# Patient Record
Sex: Male | Born: 1965 | Race: White | Hispanic: No
Health system: Southern US, Community
[De-identification: ages and names within clinical notes are randomized; demographics above are authoritative.]

---

## 1998-11-14 ENCOUNTER — Encounter: Payer: Self-pay | Admitting: Neurosurgery

## 1998-11-18 ENCOUNTER — Inpatient Hospital Stay (HOSPITAL_COMMUNITY): Admission: RE | Admit: 1998-11-18 | Discharge: 1998-11-19 | Payer: Self-pay | Admitting: Neurosurgery

## 1998-11-18 ENCOUNTER — Encounter: Payer: Self-pay | Admitting: Neurosurgery

## 1998-12-08 ENCOUNTER — Encounter: Payer: Self-pay | Admitting: Neurosurgery

## 1998-12-08 ENCOUNTER — Encounter: Admission: RE | Admit: 1998-12-08 | Discharge: 1998-12-08 | Payer: Self-pay | Admitting: Neurosurgery

## 1998-12-30 ENCOUNTER — Encounter: Admission: RE | Admit: 1998-12-30 | Discharge: 1998-12-30 | Payer: Self-pay | Admitting: Neurosurgery

## 1998-12-30 ENCOUNTER — Encounter: Payer: Self-pay | Admitting: Neurosurgery

## 1999-03-26 ENCOUNTER — Encounter: Admission: RE | Admit: 1999-03-26 | Discharge: 1999-03-26 | Payer: Self-pay | Admitting: Neurosurgery

## 1999-03-26 ENCOUNTER — Encounter: Payer: Self-pay | Admitting: Neurosurgery

## 2007-10-27 ENCOUNTER — Ambulatory Visit (HOSPITAL_COMMUNITY): Admission: RE | Admit: 2007-10-27 | Discharge: 2007-10-27 | Payer: Self-pay | Admitting: Neurosurgery

## 2007-10-30 ENCOUNTER — Ambulatory Visit (HOSPITAL_COMMUNITY): Admission: RE | Admit: 2007-10-30 | Discharge: 2007-10-30 | Payer: Self-pay | Admitting: Neurosurgery

## 2007-10-30 IMAGING — CT CT L SPINE W/ CM
2 of 13 series · 5 of 33 positions shown, 6 images · IV contrast (omnipaque)
Comparison: None
COMPARISON: None

CLINICAL DATA: Back pain with lumbar radiculopathy.

]
MYELOGRAM LUMBAR
TECHNIQUE: Lumbar puncture injection of contrast was performed by
Dr. JUMPER at the L3-4 level. Following injection of intrathecal
Omnipaque contrast, spine imaging in multiple projections was
performed using fluoroscopy.
CT MYELOGRAPHY LUMBAR SPINE
TECHNIQUE: CT imaging of the lumbar spine was performed after
intrathecal contrast administration.  Multiplanar CT image
reconstructions were also generated.

[Series 2: l-spine helical · axial · 0.27mm/px · z∈[-230,-136]mm · 2 of 115 slices shown, 3 images]
[im 39/115  soft-tissue]
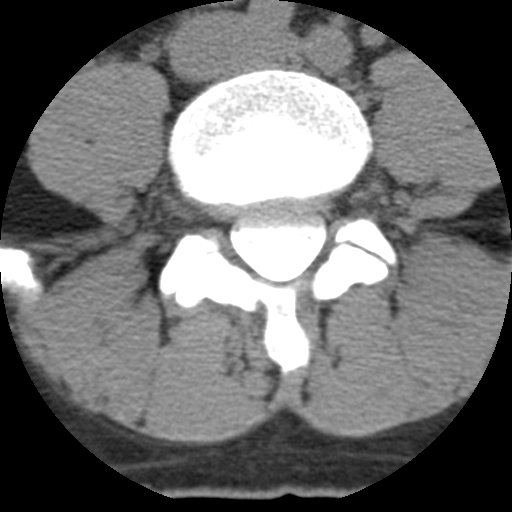
[im 39/115  bone]
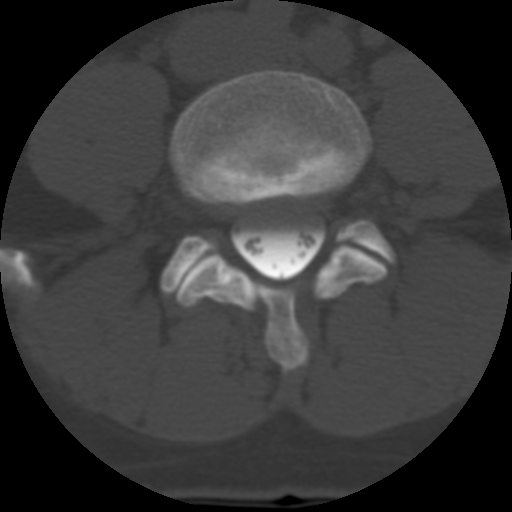
[im 77/115  bone]
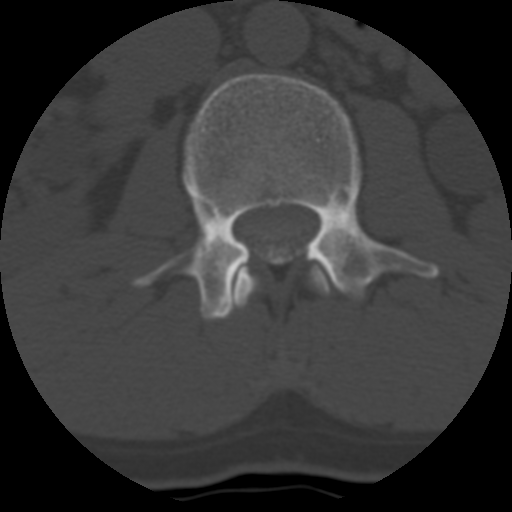

[Series 103: sag supine detail · sagittal · 0.57mm/px · 3 of 62 slices shown]
[im 16/62  bone]
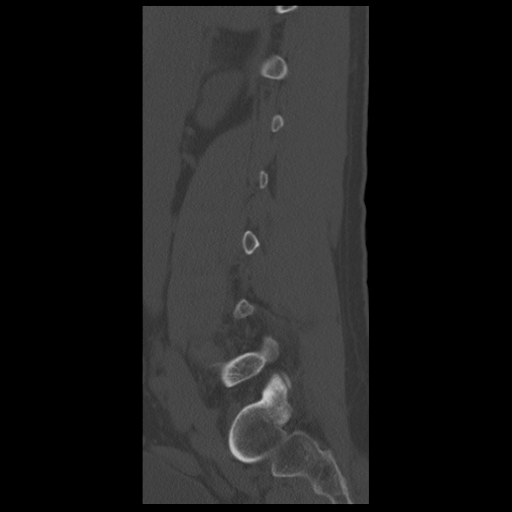
[im 31/62  bone]
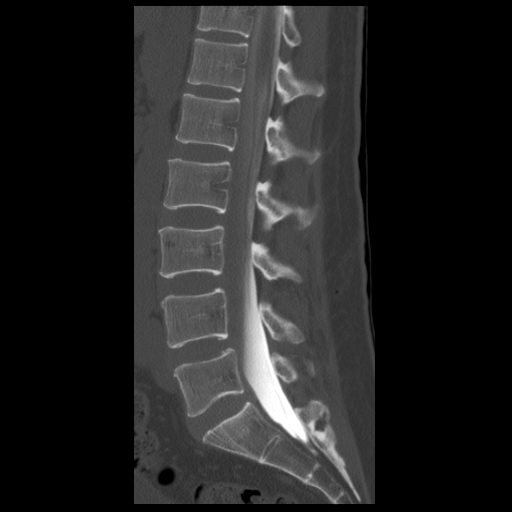
[im 46/62  bone]
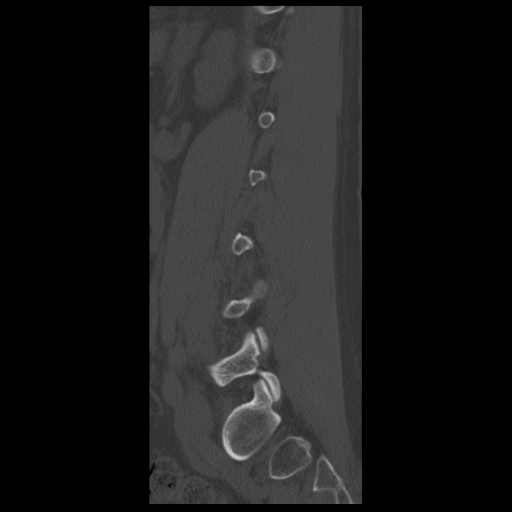

[5 of 33 positions shown; findings below may reference images not displayed]

FINDINGS: The lumbar alignment is normal.  There is no significant
spinal stenosis.  There is no intradural or extradural defect.
Disc spaces are well maintained.
IMPRESSION: Negative
FINDINGS: The patient was scanned supine and prone due to layering
of the contrast on initial supine images.

The lumbar alignment is normal.  There is no fracture or bone
lesion.  The conus medullaris appears normal.

L1-2:  Mild disc bulging

L2-3:  Mild disc degeneration and disc bulging without stenosis of
the canal.

L3-4:  Mild disc degeneration and disc bulging.  There is mild
facet   and ligament hypertrophy without significant spinal
stenosis.

L4-5:  Mild disc degeneration without spinal stenosis.

L5-S:  There is a small central to right sided disc protrusion
which is not causing displacement or compression of the sacral
nerve roots.  There is early facet degeneration.
IMPRESSION: Mild lumbar degenerative changes as above without significant
spinal stenosis.  There is a small central disc protrusion at L5-S1
without neural impingement.

## 2007-11-10 ENCOUNTER — Encounter: Admission: RE | Admit: 2007-11-10 | Discharge: 2007-11-10 | Payer: Self-pay | Admitting: Neurosurgery

## 2007-11-24 ENCOUNTER — Encounter: Admission: RE | Admit: 2007-11-24 | Discharge: 2007-11-24 | Payer: Self-pay | Admitting: Neurosurgery

## 2007-12-27 ENCOUNTER — Encounter: Admission: RE | Admit: 2007-12-27 | Discharge: 2007-12-27 | Payer: Self-pay | Admitting: Neurosurgery

## 2008-08-09 ENCOUNTER — Ambulatory Visit (HOSPITAL_COMMUNITY): Admission: RE | Admit: 2008-08-09 | Discharge: 2008-08-10 | Payer: Self-pay | Admitting: Neurosurgery

## 2008-11-22 ENCOUNTER — Encounter: Admission: RE | Admit: 2008-11-22 | Discharge: 2008-11-22 | Payer: Self-pay | Admitting: Neurosurgery

## 2008-11-25 ENCOUNTER — Encounter: Admission: RE | Admit: 2008-11-25 | Discharge: 2008-11-25 | Payer: Self-pay | Admitting: Neurosurgery

## 2010-01-25 ENCOUNTER — Encounter: Payer: Self-pay | Admitting: Neurosurgery

## 2010-01-26 ENCOUNTER — Encounter: Payer: Self-pay | Admitting: Neurosurgery

## 2010-04-12 LAB — CBC
HCT: 44.1 % (ref 39.0–52.0)
Hemoglobin: 15.4 g/dL (ref 13.0–17.0)
MCHC: 34.9 g/dL (ref 30.0–36.0)
Platelets: 226 10*3/uL (ref 150–400)
RBC: 4.65 MIL/uL (ref 4.22–5.81)
RDW: 13 % (ref 11.5–15.5)
WBC: 6.8 10*3/uL (ref 4.0–10.5)

## 2010-05-19 NOTE — Op Note (Signed)
NAME:  Maurice Murphy, Maurice Murphy                ACCOUNT NO.:  [id number]   MEDICAL RECORD NO.:  [id number]          PATIENT TYPE:  AMB   LOCATION:  SDS                          FACILITY:  [facility]   PHYSICIAN:  Hilda Lias, M.D.   DATE OF BIRTH:  Jun 05, 1965   DATE OF PROCEDURE:  08/09/2008  DATE OF DISCHARGE:                               OPERATIVE REPORT   PREOPERATIVE DIAGNOSES:  Right L5-S1 chronic radiculopathy, herniated  disk L5-S1.   POSTOPERATIVE DIAGNOSES:  Right L5-S1 chronic radiculopathy, herniated  disk L5-S1.   PROCEDURE:  Right L5-S1 foraminotomy, decompression of the L5-S1 nerve  root.   SURGEON:  Hilda Lias, MD   ASSISTANT:  Clydene Fake, MD.   CLINICAL HISTORY:  [name] is a 45 year old gentleman who I have been  following in my office because of back pain radiating down to the right  leg.  The patient has failed conservative treatment.  X-rays show some  herniated disk at L5-S1 with foraminal stenosis.  The patient wanted to  proceed with surgery because he was not any better.  The patient knew  about the risks, such as infection, CSF leak, worsening pain, no  improvement whatsoever in the chronicity of the pain.  Also, he knew the  possibility of further surgery, which may include lumbar fusion.   PROCEDURE IN DETAIL:  The patient was taken to the operating room.  He  was positioned in a prone manner.  The back was cleaned with DuraPrep.  X-rays showed that the needle was at the level of L5-S1.  Then, midline  incision of L5-S1 was made.  Through the thick adipose tissue, we  entered the fascia.  The fascia was retracted laterally well to  visualize the L5-S1 space.  Immediately, we found that the lamina quite  vertical.  We had to tilt the patient towards the left to be able to see  the L5-S1 space.  A thick calcified ligament was found.  Then with the  drill, we started drilling at the level lamina of L5 and S1.  The  patient has quite a bit of adhesion  mostly compromising the takeoff S1  nerve root.  Coagulation was done and the lysis was accomplished.  At  the end, we were able to move the L5 nerve root.  There was some  narrowing for the L5 nerve root and foraminotomy was accomplished.  We  retracted the thecal sac laterally and there was a bulging disk, but  there was no evidence of any herniated disk.  Because of that, we  continued with foraminotomy and we left plenty of space at the L5-S1  nerve root.  Valsalva maneuver was negative, and then the wound was  closed with Vicryl and Steri-Strips.           ______________________________  Hilda Lias, M.D.     EB/MEDQ  D:  08/09/2008  T:  08/09/2008  Job:  [id number]

## 2010-05-19 NOTE — H&P (Signed)
NAME:  Maurice Murphy, Maurice Murphy                ACCOUNT NO.:  [id number]   MEDICAL RECORD NO.:  [id number]          PATIENT TYPE:  OIB   LOCATION:  3537                         FACILITY:  [facility]   PHYSICIAN:  Hilda Lias, M.D.   DATE OF BIRTH:  03/22/1965   DATE OF ADMISSION:  08/09/2008  DATE OF DISCHARGE:                              HISTORY & PHYSICAL   Mr. Negron is a gentleman who had been seen by me for almost a year  complaining of back pain radiation to the right leg which is getting  worse.  The patient has conservative treatment.  He has a epidural  injection, physical therapy, and he is doing better.  He came last time  to see me in April 2010 because he told that he was getting worse.  He  wanted to proceed with surgery because he cannot deal with the pain.   PAST MEDICAL HISTORY:  Cervical fusion by me in 2000.   MEDICATIONS:  He has taken some medication for pain and muscle relaxant.   FAMILY HISTORY:  Father has heart disease and mother also has a history  of MI.   SOCIAL HISTORY:  Negative.   REVIEW OF SYSTEMS:  Possibly for back pain and leg pain.   PHYSICAL EXAMINATION:  GENERAL:  The patient came to my office limping  from the right leg.  He had difficulty sitting and standing.  HEAD, EARS, NOSE, AND THROAT:  Normal.  NECK:  Normal.  LUNGS:  Clear.  HEART:  Heart sound normal.  ABDOMEN:  Normal.  EXTREMITIES:  Normal pulses.  NEUROLOGIC:  Mental status normal.  Cranial nerves normal.  His strength  is 5/5 with weakness on dorsiflexion of the right foot.  Straight leg  raising is positive in the right side around 60 degrees.   X-ray showed that he has a herniated disk of the L4-L5.   RECOMMENDATIONS:  The patient is being admitted for surgery.  Procedure  will be L4-5 diskectomy followed by foraminotomy.  He knows about the  risks such as infection, CSF leak, worsening pain, paralysis, need for  surgery.           ______________________________  Hilda Lias, M.D.     EB/MEDQ  D:  08/09/2008  T:  08/09/2008  Job:  [id number]

## 2010-05-22 NOTE — Discharge Summary (Signed)
[place]  Patient:    Maurice Murphy Visit Number: [id number] MRN: [id number]          Service Type: SUR Location: [id number] Attending Physician:  Danella Penton Admit Date:  11/18/1998 Disc. Date: 11/19/98                             Discharge Summary  ADMISSION DIAGNOSIS:  C5-6 herniated disc.  DISCHARGE DIAGNOSIS:  C5-6 herniated disc.  CLINICAL HISTORY:  The patient was admitted because of neck and left arm pain.  X-rays showed a large herniated disc of the left 5-6.  Surgery was advised.  LABORATORY DATA:  Normal.  HOSPITAL COURSE:  The patient was taken into surgery, anterior diskectomy and fusion was done.  Today he is doing better, has no pain, is ready to go home.  CONDITION ON DISCHARGE:  Improved.  DISCHARGE MEDICATIONS: 1. Percocet. 2. Diazepam.  DIET:  Regular.  ACTIVITY:  No to drive for eight to ten days.  FOLLOWUP:  He will be seen by me in two weeks. Attending Physician:  Danella Penton DD:  11/19/98 TD:  11/19/98 Job: [id number]

## 2010-05-22 NOTE — Op Note (Signed)
[place]  Patient:    Maurice Murphy                          MRN: [id number] Proc. Date: 11/18/98 Adm. Date:  [id number] Attending:  Danella Penton                           Operative Report  INDICATIONS:  Mr. Heo is a 45 year old gentleman who was in my office because of neck and left upper extremity pain.  The patient was miserable.  He had failed conservative treatment.  He had weakness of the biceps and left wrist extension. X-ray showed a large herniated disk at L5-L6.  Surgery was advised.  The patient knew of the risks such as infection, CSF leak, worsening of the pain, and need or further surgery, damage to the ______ cord, ______ and stroke.  DESCRIPTION OF PROCEDURE:  The patient was taken to the OR and intubation was carried out.  The left side of the neck was prepped with Betadine.  A transverse incision was made through the skin and platysma down into the cervical spine. X-ray showed that indeed we were at the level of L5-L6.  There were two large anterior osteophytes which were removed.  Then we opened the anterior ligament nd with the help of the microscope and the microcuret we started doing dissection. We did a total diskectomy.  Indeed we found that there was a large opening in the posterior ligament with disk going from the middle down to the left side. Incision of the ligament was made.  Free fragments of disk were removed.  There was an osteophyte and decompression of the C6 nerve root was done bilaterally.  Having done this, having plenty of space, a piece of bone 5 mm high was inserted. A ______ from C5 to C6 using ______ was done.  The area was irrigated. Hemostasis was done with bipolar cautery.  At the end we did an x-ray which showed that there was good position of the plate on the graft.  From then on the area as irrigated and closed with Vicryl and Steri-Strips.  The patient did well. DD:   11/18/98 TD:  11/18/98 Job: [id number]

## 2010-11-24 ENCOUNTER — Other Ambulatory Visit (HOSPITAL_COMMUNITY): Payer: Self-pay | Admitting: Internal Medicine

## 2010-11-24 DIAGNOSIS — M545 Low back pain: Secondary | ICD-10-CM

## 2010-12-14 ENCOUNTER — Inpatient Hospital Stay (HOSPITAL_COMMUNITY): Admission: RE | Admit: 2010-12-14 | Payer: Self-pay | Source: Ambulatory Visit

## 2010-12-21 ENCOUNTER — Other Ambulatory Visit (HOSPITAL_COMMUNITY): Payer: Self-pay

## 2010-12-23 ENCOUNTER — Other Ambulatory Visit (HOSPITAL_COMMUNITY): Payer: Self-pay

## 2015-05-26 ENCOUNTER — Encounter: Payer: BLUE CROSS/BLUE SHIELD | Admitting: Neurology

## 2015-05-26 ENCOUNTER — Telehealth: Payer: Self-pay | Admitting: Neurology

## 2015-05-26 NOTE — Telephone Encounter (Signed)
This patient did not show for an EMG and nerve conduction study appointment today. 

## 2015-05-27 ENCOUNTER — Encounter: Payer: Self-pay | Admitting: Neurology
